# Patient Record
Sex: Female | Born: 1955 | Race: White | Hispanic: No | Marital: Married | Smoking: Former smoker
Health system: Southern US, Community
[De-identification: ages and names within clinical notes are randomized; demographics above are authoritative.]

## PROBLEM LIST (undated history)

## (undated) DIAGNOSIS — D649 Anemia, unspecified: Secondary | ICD-10-CM

## (undated) HISTORY — PX: ABDOMINAL HYSTERECTOMY: SHX81

## (undated) HISTORY — PX: COLONOSCOPY: SHX174

## (undated) HISTORY — PX: UPPER GI ENDOSCOPY: SHX6162

---

## 2004-09-06 ENCOUNTER — Ambulatory Visit: Payer: Self-pay | Admitting: Obstetrics and Gynecology

## 2007-09-28 ENCOUNTER — Ambulatory Visit: Payer: Self-pay | Admitting: Specialist

## 2007-10-09 ENCOUNTER — Ambulatory Visit: Payer: Self-pay | Admitting: Specialist

## 2007-12-26 ENCOUNTER — Ambulatory Visit: Payer: Self-pay | Admitting: Unknown Physician Specialty

## 2008-01-30 ENCOUNTER — Ambulatory Visit: Payer: Self-pay | Admitting: Specialist

## 2008-02-06 ENCOUNTER — Ambulatory Visit: Payer: Self-pay | Admitting: Specialist

## 2013-01-15 ENCOUNTER — Ambulatory Visit: Payer: Self-pay

## 2013-01-18 ENCOUNTER — Ambulatory Visit: Payer: Self-pay

## 2013-02-14 ENCOUNTER — Ambulatory Visit: Payer: Self-pay

## 2014-01-30 ENCOUNTER — Ambulatory Visit: Payer: Self-pay | Admitting: Internal Medicine

## 2014-02-05 ENCOUNTER — Ambulatory Visit: Payer: Self-pay | Admitting: Internal Medicine

## 2014-05-19 ENCOUNTER — Ambulatory Visit
Admit: 2014-05-19 | Disposition: A | Discharge status: Home / Self Care | Payer: Self-pay | Attending: Family Medicine | Admitting: Family Medicine

## 2014-11-26 ENCOUNTER — Other Ambulatory Visit: Payer: Self-pay | Admitting: Orthopedic Surgery

## 2014-11-26 DIAGNOSIS — M25561 Pain in right knee: Secondary | ICD-10-CM

## 2014-11-26 DIAGNOSIS — M2391 Unspecified internal derangement of right knee: Secondary | ICD-10-CM

## 2014-12-04 ENCOUNTER — Ambulatory Visit: Payer: Self-pay

## 2014-12-05 ENCOUNTER — Ambulatory Visit: Payer: Managed Care, Other (non HMO)

## 2015-11-15 IMAGING — CT CT ABDOMEN W/ CM
2 of 5 series · 17 of 46 positions shown, 19 images · IV contrast (omnipaque)
Comparison: None

CLINICAL DATA: RIGHT upper quadrant pain for 2 weeks, no trauma,
negative ultrasound and hepatobiliary imaging exams

EXAM:
CT ABDOMEN WITH CONTRAST
TECHNIQUE: Multidetector CT imaging of the abdomen was performed using the
standard protocol following bolus administration of intravenous
contrast. Sagittal and coronal MPR images reconstructed from axial
data set. Pelvis not imaged.
CONTRAST:  Dilute oral contrast.  100 mL Omnipaque 300 IV.

[Series 2: routine abd pel with · axial · 0.63mm/px · z∈[+412,+692]mm · 14 of 65 slices shown, 16 images]
[im 5/65  soft-tissue]
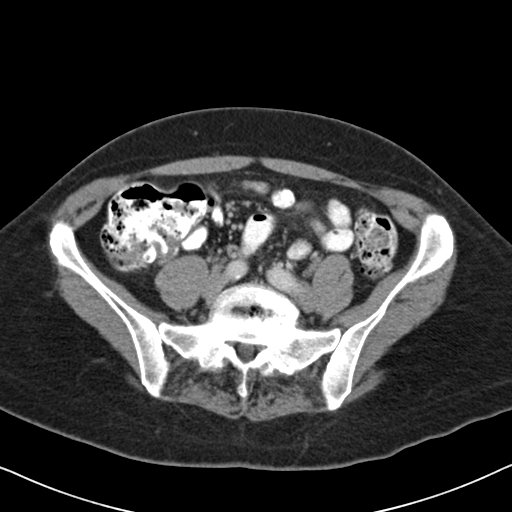
[im 5/65  bone]
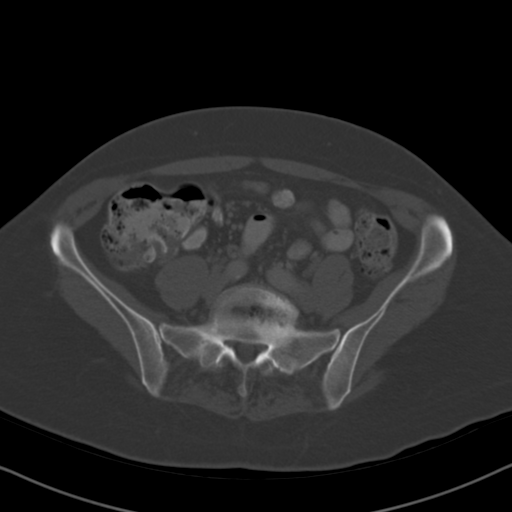
[im 9/65  soft-tissue]
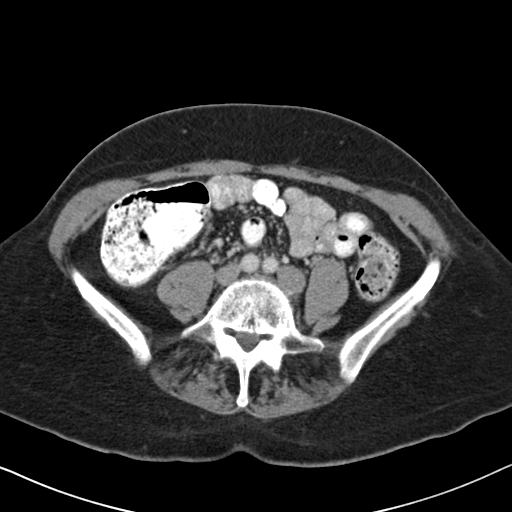
[im 13/65  soft-tissue]
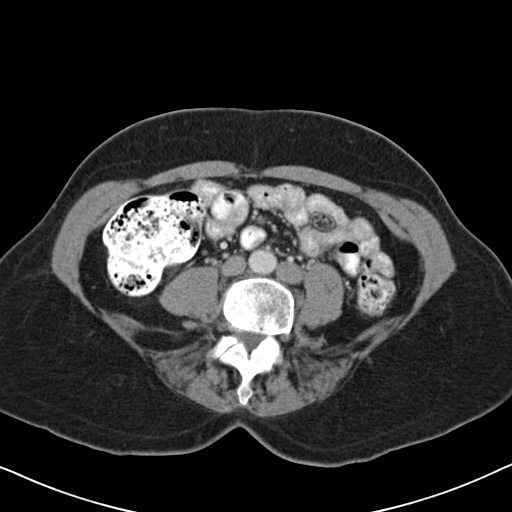
[im 17/65  soft-tissue]
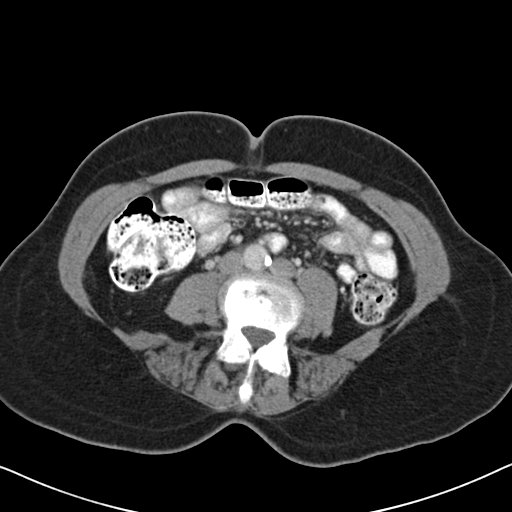
[im 21/65  soft-tissue]
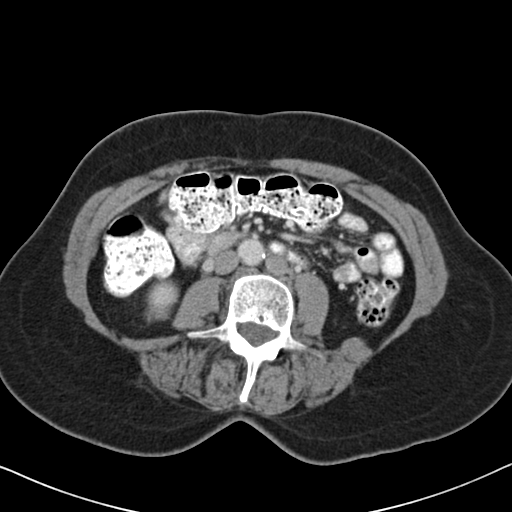
[im 25/65  soft-tissue]
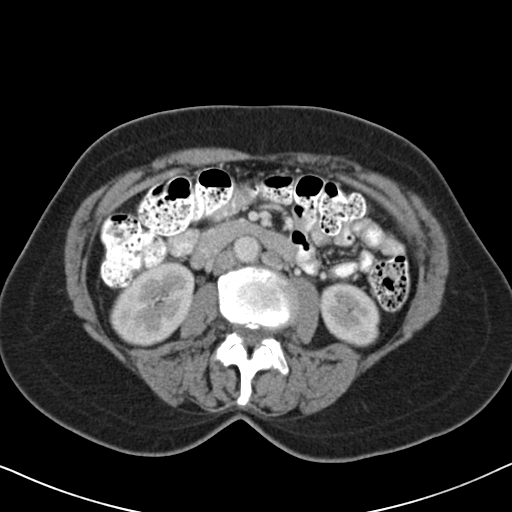
[im 29/65  soft-tissue]
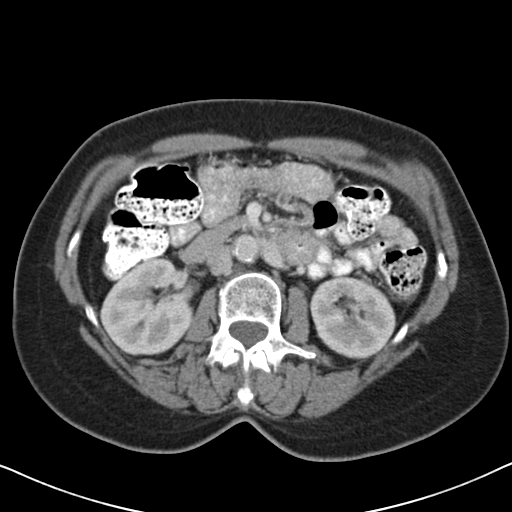
[im 37/65  soft-tissue]
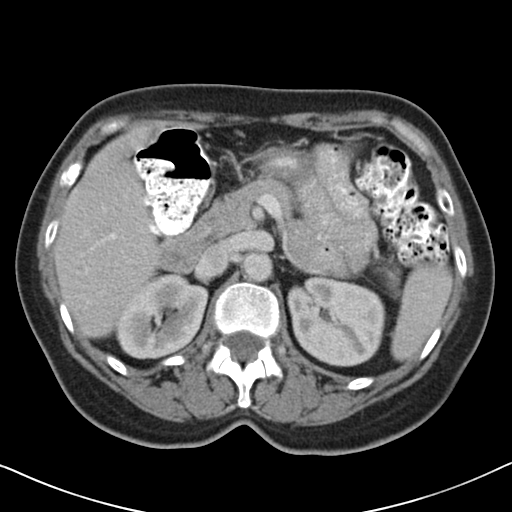
[im 41/65  soft-tissue]
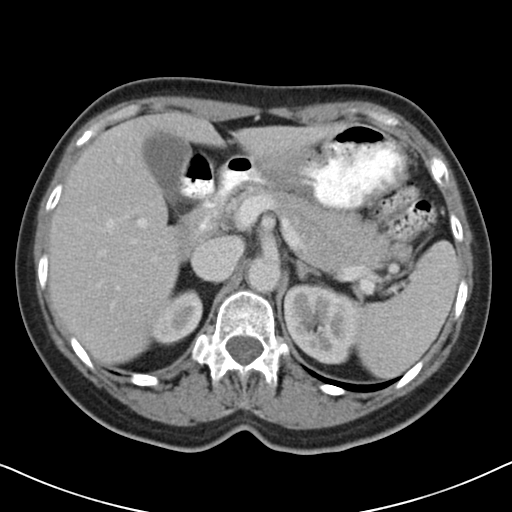
[im 41/65  bone]
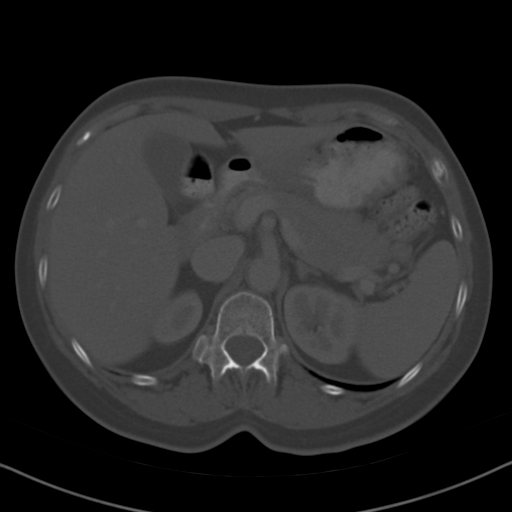
[im 45/65  soft-tissue]
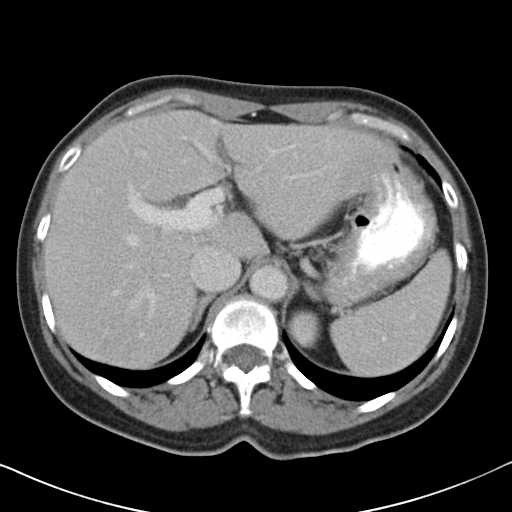
[im 49/65  soft-tissue]
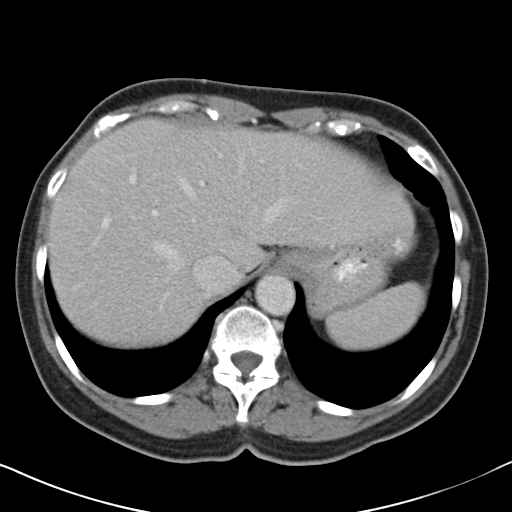
[im 53/65  soft-tissue]
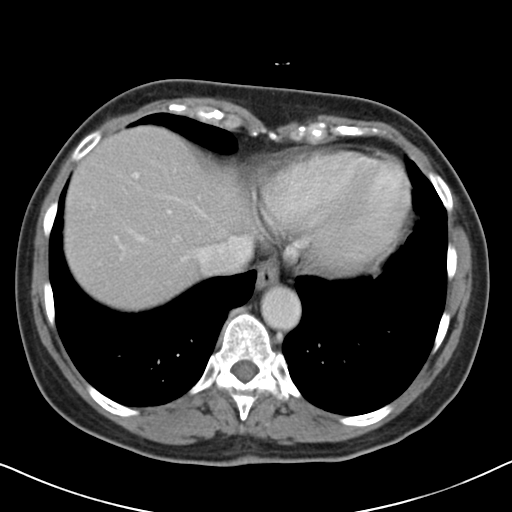
[im 57/65  soft-tissue]
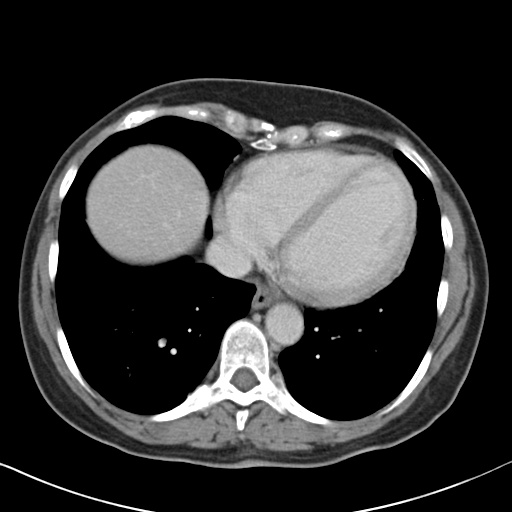
[im 61/65  soft-tissue]
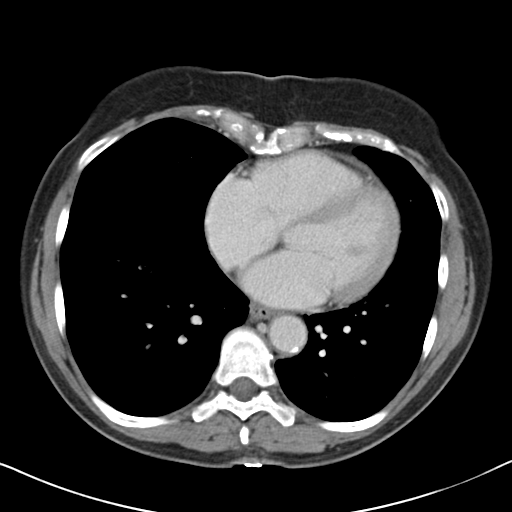

[Series 6: cor routine abd pel with · coronal · 0.58mm/px · 3 of 121 slices shown]
[im 41/121  soft-tissue]
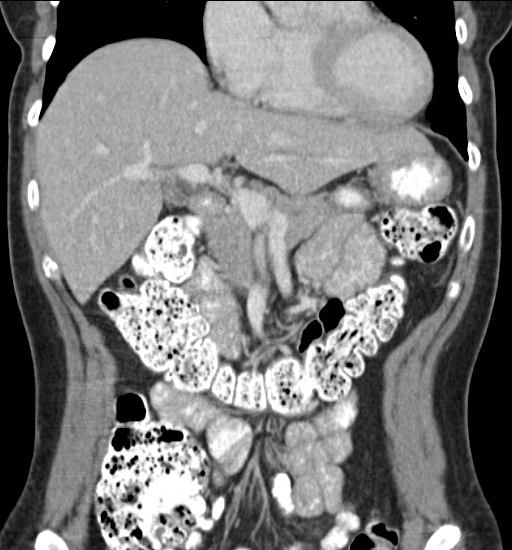
[im 54/121  soft-tissue]
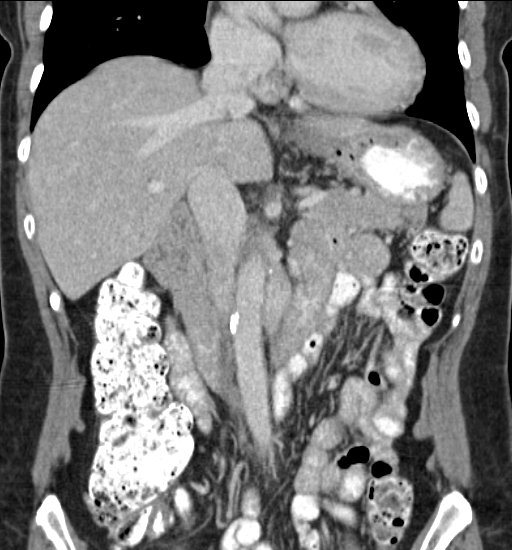
[im 67/121  soft-tissue]
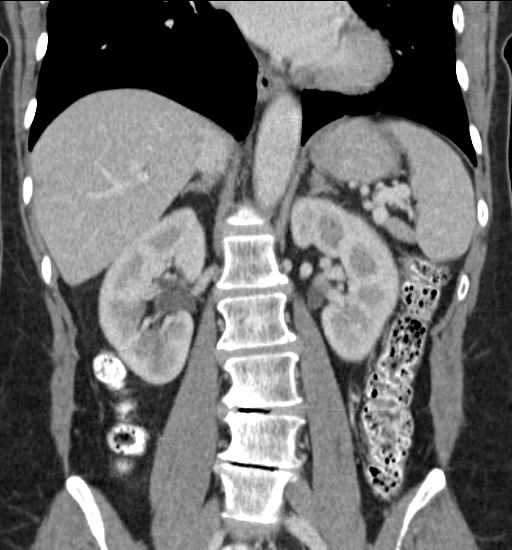

[17 of 46 positions shown; findings below may reference images not displayed]

FINDINGS: Lung bases clear.

Small LEFT adrenal nodule 11 x 8 mm demonstrating 50% washout on
delayed images consistent with an adrenal adenoma.

Tiny nonobstructing calculus 2 mm diameter and inferior pole LEFT
kidney image 40.

Liver, spleen, pancreas, kidneys, and adrenal glands otherwise
normal appearance.

Normal appendix.

Tiny umbilical hernia containing fat.

Stomach and visualized bowel loops grossly unremarkable.

No mass, adenopathy, free fluid, or inflammatory process.

Mild scattered degenerative disc disease changes lumbar spine.

Few atherosclerotic calcifications.
IMPRESSION: Nonobstructing LEFT lower pole renal calculus 2 mm diameter.

Tiny umbilical hernia.

Small LEFT adrenal adenoma.

No acute intra-abdominal abnormalities.

## 2018-11-23 ENCOUNTER — Other Ambulatory Visit
Admission: RE | Admit: 2018-11-23 | Discharge: 2018-11-23 | Disposition: A | Discharge status: Home / Self Care | Payer: Managed Care, Other (non HMO) | Source: Ambulatory Visit | Attending: Internal Medicine | Admitting: Internal Medicine

## 2018-11-23 DIAGNOSIS — Z01812 Encounter for preprocedural laboratory examination: Secondary | ICD-10-CM | POA: Diagnosis present

## 2018-11-23 DIAGNOSIS — Z20828 Contact with and (suspected) exposure to other viral communicable diseases: Secondary | ICD-10-CM | POA: Diagnosis not present

## 2018-11-23 LAB — SARS CORONAVIRUS 2 (TAT 6-24 HRS): SARS Coronavirus 2: NEGATIVE

## 2018-11-28 ENCOUNTER — Encounter
Admission: RE | Disposition: A | Discharge status: Home / Self Care | Payer: Self-pay | Source: Home / Self Care | Attending: Internal Medicine

## 2018-11-28 ENCOUNTER — Ambulatory Visit: Payer: Managed Care, Other (non HMO) | Admitting: Anesthesiology

## 2018-11-28 ENCOUNTER — Ambulatory Visit
Admission: RE | Admit: 2018-11-28 | Discharge: 2018-11-28 | Disposition: A | Discharge status: Home / Self Care | Payer: Managed Care, Other (non HMO) | Attending: Internal Medicine | Admitting: Internal Medicine

## 2018-11-28 ENCOUNTER — Other Ambulatory Visit: Payer: Self-pay

## 2018-11-28 DIAGNOSIS — Z1211 Encounter for screening for malignant neoplasm of colon: Secondary | ICD-10-CM | POA: Diagnosis present

## 2018-11-28 DIAGNOSIS — Z87891 Personal history of nicotine dependence: Secondary | ICD-10-CM | POA: Diagnosis not present

## 2018-11-28 DIAGNOSIS — K640 First degree hemorrhoids: Secondary | ICD-10-CM | POA: Insufficient documentation

## 2018-11-28 DIAGNOSIS — K5730 Diverticulosis of large intestine without perforation or abscess without bleeding: Secondary | ICD-10-CM | POA: Diagnosis not present

## 2018-11-28 DIAGNOSIS — K635 Polyp of colon: Secondary | ICD-10-CM | POA: Diagnosis not present

## 2018-11-28 DIAGNOSIS — Q439 Congenital malformation of intestine, unspecified: Secondary | ICD-10-CM | POA: Insufficient documentation

## 2018-11-28 HISTORY — PX: COLONOSCOPY WITH PROPOFOL: SHX5780

## 2018-11-28 HISTORY — DX: Anemia, unspecified: D64.9

## 2018-11-28 SURGERY — COLONOSCOPY WITH PROPOFOL
Anesthesia: General

## 2018-11-28 MED ORDER — SODIUM CHLORIDE 0.9 % IV SOLN
INTRAVENOUS | Status: DC
  Administered 2018-11-28: 14:00:00 via INTRAVENOUS

## 2018-11-28 MED ORDER — PROPOFOL 10 MG/ML IV BOLUS
INTRAVENOUS | Status: DC | PRN
  Administered 2018-11-28: 50 mg via INTRAVENOUS
  Administered 2018-11-28: 30 mg via INTRAVENOUS
  Administered 2018-11-28: 70 mg via INTRAVENOUS

## 2018-11-28 MED ORDER — PROPOFOL 500 MG/50ML IV EMUL
INTRAVENOUS | Status: DC | PRN
  Administered 2018-11-28: 150 ug/kg/min via INTRAVENOUS

## 2018-11-28 NOTE — Anesthesia Preprocedure Evaluation (Signed)
Anesthesia Evaluation  Patient identified by MRN, date of birth, ID band Patient awake    Reviewed: Allergy & Precautions, NPO status , Patient's Chart, lab work & pertinent test results  History of Anesthesia Complications Negative for: history of anesthetic complications  Airway Mallampati: II       Dental   Pulmonary neg sleep apnea, neg COPD, Not current smoker, former smoker,           Cardiovascular (-) hypertension(-) Past MI and (-) CHF (-) dysrhythmias (-) Valvular Problems/Murmurs     Neuro/Psych neg Seizures    GI/Hepatic Neg liver ROS, neg GERD  ,  Endo/Other  neg diabetes  Renal/GU negative Renal ROS     Musculoskeletal   Abdominal   Peds  Hematology  (+) anemia ,   Anesthesia Other Findings   Reproductive/Obstetrics                             Anesthesia Physical Anesthesia Plan  ASA: II  Anesthesia Plan: General   Post-op Pain Management:    Induction: Intravenous  PONV Risk Score and Plan: 3 and Propofol infusion, TIVA and Ondansetron  Airway Management Planned: Nasal Cannula  Additional Equipment:   Intra-op Plan:   Post-operative Plan:   Informed Consent: I have reviewed the patients History and Physical, chart, labs and discussed the procedure including the risks, benefits and alternatives for the proposed anesthesia with the patient or authorized representative who has indicated his/her understanding and acceptance.       Plan Discussed with:   Anesthesia Plan Comments:         Anesthesia Quick Evaluation

## 2018-11-28 NOTE — Transfer of Care (Signed)
Immediate Anesthesia Transfer of Care Note  Patient: Melissa Copeland  Procedure(s) Performed: COLONOSCOPY WITH PROPOFOL (N/A )  Patient Location: PACU  Anesthesia Type:General  Level of Consciousness: sedated  Airway & Oxygen Therapy: Patient Spontanous Breathing and Patient connected to nasal cannula oxygen  Post-op Assessment: Report given to RN and Post -op Vital signs reviewed and stable  Post vital signs: Reviewed and stable  Last Vitals:  Vitals Value Taken Time  BP 104/67 11/28/18 1554  Temp    Pulse 71 11/28/18 1554  Resp 21 11/28/18 1554  SpO2 98 % 11/28/18 1554  Vitals shown include unvalidated device data.  Last Pain:  Vitals:   11/28/18 1407  TempSrc: Oral         Complications: No apparent anesthesia complications

## 2018-11-28 NOTE — Interval H&P Note (Signed)
History and Physical Interval Note:  11/28/2018 2:01 PM  Melissa Copeland  has presented today for surgery, with the diagnosis of [place].  The various methods of treatment have been discussed with the patient and family. After consideration of risks, benefits and other options for treatment, the patient has consented to  Procedure(s): COLONOSCOPY WITH PROPOFOL (N/A) as a surgical intervention.  The patient's history has been reviewed, patient examined, no change in status, stable for surgery.  I have reviewed the patient's chart and labs.  Questions were answered to the patient's satisfaction.     [place], [place]

## 2018-11-28 NOTE — Anesthesia Post-op Follow-up Note (Signed)
Anesthesia QCDR form completed.        

## 2018-11-28 NOTE — Op Note (Signed)
[facility] Gastroenterology Patient Name: Melissa Copeland Procedure Date: 11/28/2018 3:12 PM MRN: [id number] Account #: [id number] Date of Birth: [birth year] Admit Type: Outpatient Age: 63 Room: [facility] ENDO ROOM 3 Gender: Female Note Status: Finalized Procedure:            Colonoscopy Indications:          Screening for colorectal malignant neoplasm Providers:            Benay Pike. Swati Granberry MD, MD Medicines:            Propofol per Anesthesia Complications:        No immediate complications. Procedure:            Pre-Anesthesia Assessment:                       - The risks and benefits of the procedure and the                        sedation options and risks were discussed with the                        patient. All questions were answered and informed                        consent was obtained.                       - Patient identification and proposed procedure were                        verified prior to the procedure by the nurse. The                        procedure was verified in the procedure room.                       - ASA Grade Assessment: III - A patient with severe                        systemic disease.                       - After reviewing the risks and benefits, the patient                        was deemed in satisfactory condition to undergo the                        procedure.                       After obtaining informed consent, the colonoscope was                        passed under direct vision. Throughout the procedure,                        the patient's blood pressure, pulse, and oxygen                        saturations were monitored continuously. The  Colonoscope was introduced through the anus and                        advanced to the the cecum, identified by appendiceal                        orifice and ileocecal valve. The colonoscopy was                        somewhat difficult due to restricted  mobility of the                        colon and a tortuous colon. Successful completion of                        the procedure was aided by straightening and shortening                        the scope to obtain bowel loop reduction. The patient                        tolerated the procedure well. The quality of the bowel                        preparation was good. The ileocecal valve, appendiceal                        orifice, and rectum were photographed. Findings:      The perianal and digital rectal examinations were normal. Pertinent       negatives include normal sphincter tone and no palpable rectal lesions.      Many small-mouthed diverticula were found in the entire colon.      A 5 mm polyp was found in the sigmoid colon. The polyp was sessile. The       polyp was removed with a jumbo cold forceps. Resection and retrieval       were complete.      Non-bleeding internal hemorrhoids were found during retroflexion. The       hemorrhoids were Grade I (internal hemorrhoids that do not prolapse).      The exam was otherwise without abnormality. Impression:           - Diverticulosis in the entire examined colon.                       - One 5 mm polyp in the sigmoid colon, removed with a                        jumbo cold forceps. Resected and retrieved.                       - Non-bleeding internal hemorrhoids.                       - The examination was otherwise normal. Recommendation:       - Patient has a contact number available for                        emergencies. The signs and symptoms of potential  delayed complications were discussed with the patient.                        Return to normal activities tomorrow. Written discharge                        instructions were provided to the patient.                       - Resume previous diet.                       - Continue present medications.                       - Repeat colonoscopy is recommended  for surveillance.                        The colonoscopy date will be determined after pathology                        results from today's exam become available for review.                       - Return to GI office PRN.                       - The findings and recommendations were discussed with                        the patient. Procedure Code(s):    --- Professional ---                       [phone], Colonoscopy, flexible; with biopsy, single or                        multiple Diagnosis Code(s):    --- Professional ---                       K57.30, Diverticulosis of large intestine without                        perforation or abscess without bleeding                       K63.5, Polyp of colon                       K64.0, First degree hemorrhoids                       Z12.11, Encounter for screening for malignant neoplasm                        of colon CPT copyright 2019 [employer]. All rights reserved. The codes documented in this report are preliminary and upon coder review may  be revised to meet current compliance requirements. Stanton Kidneyeodoro K Herlinda Heady MD, MD 11/28/2018 4:00:16 PM This report has been signed electronically. Number of Addenda: 0 Note Initiated On: 11/28/2018 3:12 PM Scope Withdrawal Time: 0 hours 7 minutes 12 seconds  Total Procedure Duration: 0 hours 26 minutes 53 seconds  Estimated Blood  Loss: Estimated blood loss: none.      [facility]

## 2018-11-28 NOTE — H&P (Signed)
Outpatient short stay form Pre-procedure 11/28/2018 2:00 PM Teodoro K. Alice Reichert, M.D.  Primary Physician: Emily Filbert, M.D.  Reason for visit:  Colon cancer screening  History of present illness:  Patient presents for colonoscopy for colon cancer screening. The patient denies complaints of abdominal pain, significant change in bowel habits, or rectal bleeding.     Current Facility-Administered Medications:  .  0.9 %  sodium chloride infusion, , Intravenous, Continuous, [place], Benay Pike, MD  Medications Prior to Admission  Medication Sig Dispense Refill Last Dose  . cholecalciferol (VITAMIN D3) 10 MCG (400 UNIT) TABS tablet Take 400 Units by mouth.     . sodium chloride (MURO 128) 5 % ophthalmic solution 1 drop as needed for eye irritation.     Marland Kitchen venlafaxine XR (EFFEXOR-XR) 75 MG 24 hr capsule Take 75 mg by mouth daily with breakfast.        Allergies  Allergen Reactions  . Penicillins      Past Medical History:  Diagnosis Date  . Anemia     Review of systems:  Otherwise negative.    Physical Exam  Gen: Alert, oriented. Appears stated age.  HEENT: [place]/AT. PERRLA. Lungs: CTA, no wheezes. CV: RR nl S1, S2. Abd: soft, benign, no masses. BS+ Ext: No edema. Pulses 2+    Planned procedures: Proceed with colonoscopy. The patient understands the nature of the planned procedure, indications, risks, alternatives and potential complications including but not limited to bleeding, infection, perforation, damage to internal organs and possible oversedation/side effects from anesthesia. The patient agrees and gives consent to proceed.  Please refer to procedure notes for findings, recommendations and patient disposition/instructions.     Teodoro K. Alice Reichert, M.D. Gastroenterology 11/28/2018  2:00 PM

## 2018-11-29 ENCOUNTER — Encounter: Payer: Self-pay | Admitting: Internal Medicine

## 2018-11-30 LAB — SURGICAL PATHOLOGY

## 2018-12-11 NOTE — Anesthesia Postprocedure Evaluation (Signed)
Anesthesia Post Note  Patient: Melissa Copeland  Procedure(s) Performed: COLONOSCOPY WITH PROPOFOL (N/A )  Patient location during evaluation: PACU Anesthesia Type: General Level of consciousness: awake and alert Pain management: pain level controlled Vital Signs Assessment: post-procedure vital signs reviewed and stable Respiratory status: spontaneous breathing, nonlabored ventilation, respiratory function stable and patient connected to nasal cannula oxygen Cardiovascular status: blood pressure returned to baseline and stable Postop Assessment: no apparent nausea or vomiting Anesthetic complications: no     Last Vitals:  Vitals:   11/28/18 1604 11/28/18 1614  BP: (!) 111/55 133/85  Pulse: 74 68  Resp: 20 18  Temp:    SpO2: 96% 97%    Last Pain:  Vitals:   11/29/18 0743  TempSrc:   PainSc: 0-No pain                 Molli Barrows

## 2021-05-28 ENCOUNTER — Other Ambulatory Visit (HOSPITAL_COMMUNITY): Payer: Self-pay | Admitting: Student

## 2021-05-28 ENCOUNTER — Other Ambulatory Visit: Payer: Self-pay | Admitting: Student

## 2021-05-28 DIAGNOSIS — M7581 Other shoulder lesions, right shoulder: Secondary | ICD-10-CM

## 2021-05-28 DIAGNOSIS — M7521 Bicipital tendinitis, right shoulder: Secondary | ICD-10-CM

## 2021-05-28 DIAGNOSIS — M25811 Other specified joint disorders, right shoulder: Secondary | ICD-10-CM

## 2021-06-11 ENCOUNTER — Ambulatory Visit
Admission: RE | Admit: 2021-06-11 | Discharge: 2021-06-11 | Disposition: A | Discharge status: Home / Self Care | Payer: Medicare Other | Source: Ambulatory Visit | Attending: Student | Admitting: Student

## 2021-06-11 DIAGNOSIS — M7521 Bicipital tendinitis, right shoulder: Secondary | ICD-10-CM | POA: Insufficient documentation

## 2021-06-11 DIAGNOSIS — M7581 Other shoulder lesions, right shoulder: Secondary | ICD-10-CM | POA: Insufficient documentation

## 2021-06-11 DIAGNOSIS — M25811 Other specified joint disorders, right shoulder: Secondary | ICD-10-CM | POA: Diagnosis present

## 2023-04-17 ENCOUNTER — Ambulatory Visit
Admission: RE | Admit: 2023-04-17 | Discharge: 2023-04-17 | Disposition: A | Discharge status: Home / Self Care | Source: Ambulatory Visit | Attending: Internal Medicine | Admitting: Internal Medicine

## 2023-04-17 ENCOUNTER — Other Ambulatory Visit: Payer: Self-pay | Admitting: Internal Medicine

## 2023-04-17 DIAGNOSIS — R1084 Generalized abdominal pain: Secondary | ICD-10-CM

## 2023-04-17 DIAGNOSIS — R1011 Right upper quadrant pain: Secondary | ICD-10-CM | POA: Insufficient documentation

## 2023-04-19 ENCOUNTER — Other Ambulatory Visit: Payer: Self-pay | Admitting: Internal Medicine

## 2023-04-19 DIAGNOSIS — R1084 Generalized abdominal pain: Secondary | ICD-10-CM

## 2023-04-19 DIAGNOSIS — R911 Solitary pulmonary nodule: Secondary | ICD-10-CM

## 2023-04-21 ENCOUNTER — Ambulatory Visit
Admission: RE | Admit: 2023-04-21 | Discharge: 2023-04-21 | Disposition: A | Discharge status: Home / Self Care | Source: Ambulatory Visit | Attending: Internal Medicine | Admitting: Internal Medicine

## 2023-04-21 DIAGNOSIS — R1084 Generalized abdominal pain: Secondary | ICD-10-CM | POA: Diagnosis present

## 2023-04-21 DIAGNOSIS — R911 Solitary pulmonary nodule: Secondary | ICD-10-CM | POA: Diagnosis present

## 2023-04-21 MED ORDER — IOHEXOL 300 MG/ML  SOLN
75.0000 mL | Freq: Once | INTRAMUSCULAR | Status: AC | PRN
  Administered 2023-04-21: 75 mL via INTRAVENOUS

## 2023-06-05 ENCOUNTER — Ambulatory Visit: Payer: Self-pay

## 2023-06-05 DIAGNOSIS — K449 Diaphragmatic hernia without obstruction or gangrene: Secondary | ICD-10-CM | POA: Diagnosis not present

## 2023-06-05 DIAGNOSIS — K5909 Other constipation: Secondary | ICD-10-CM | POA: Diagnosis not present

## 2023-06-05 DIAGNOSIS — Z791 Long term (current) use of non-steroidal anti-inflammatories (NSAID): Secondary | ICD-10-CM | POA: Diagnosis not present

## 2023-06-05 DIAGNOSIS — R1012 Left upper quadrant pain: Secondary | ICD-10-CM | POA: Diagnosis not present

## 2023-06-05 DIAGNOSIS — K2950 Unspecified chronic gastritis without bleeding: Secondary | ICD-10-CM | POA: Diagnosis not present

## 2023-06-05 DIAGNOSIS — K5730 Diverticulosis of large intestine without perforation or abscess without bleeding: Secondary | ICD-10-CM | POA: Diagnosis not present

## 2023-06-05 DIAGNOSIS — D509 Iron deficiency anemia, unspecified: Secondary | ICD-10-CM | POA: Diagnosis present

## 2023-06-16 ENCOUNTER — Other Ambulatory Visit: Payer: Self-pay | Admitting: Pulmonary Disease

## 2023-06-16 DIAGNOSIS — R911 Solitary pulmonary nodule: Secondary | ICD-10-CM

## 2023-06-23 ENCOUNTER — Ambulatory Visit
Admission: RE | Admit: 2023-06-23 | Discharge: 2023-06-23 | Disposition: A | Discharge status: Home / Self Care | Source: Ambulatory Visit | Attending: Pulmonary Disease | Admitting: Pulmonary Disease

## 2023-06-23 VITALS — Ht 69.0 in | Wt 180.0 lb

## 2023-06-23 DIAGNOSIS — N200 Calculus of kidney: Secondary | ICD-10-CM | POA: Diagnosis not present

## 2023-06-23 DIAGNOSIS — R109 Unspecified abdominal pain: Secondary | ICD-10-CM | POA: Diagnosis not present

## 2023-06-23 DIAGNOSIS — C801 Malignant (primary) neoplasm, unspecified: Secondary | ICD-10-CM | POA: Diagnosis not present

## 2023-06-23 DIAGNOSIS — K5730 Diverticulosis of large intestine without perforation or abscess without bleeding: Secondary | ICD-10-CM | POA: Insufficient documentation

## 2023-06-23 DIAGNOSIS — R911 Solitary pulmonary nodule: Secondary | ICD-10-CM | POA: Insufficient documentation

## 2023-06-23 DIAGNOSIS — I517 Cardiomegaly: Secondary | ICD-10-CM | POA: Insufficient documentation

## 2023-06-23 DIAGNOSIS — I700 Atherosclerosis of aorta: Secondary | ICD-10-CM | POA: Diagnosis not present

## 2023-06-23 DIAGNOSIS — I2510 Atherosclerotic heart disease of native coronary artery without angina pectoris: Secondary | ICD-10-CM | POA: Insufficient documentation

## 2023-06-23 LAB — GLUCOSE, CAPILLARY: Glucose-Capillary: 103 mg/dL — ABNORMAL HIGH (ref 70–99)

## 2023-06-23 MED ORDER — FLUDEOXYGLUCOSE F - 18 (FDG) INJECTION
9.9900 | Freq: Once | INTRAVENOUS | Status: AC | PRN
  Administered 2023-06-23: 9.99 via INTRAVENOUS
